# Patient Record
Sex: Female | Born: 1955 | Race: White | Hispanic: No | Marital: Married | Smoking: Former smoker
Health system: Southern US, Community
[De-identification: ages and names within clinical notes are randomized; demographics above are authoritative.]

## PROBLEM LIST (undated history)

## (undated) DIAGNOSIS — I10 Essential (primary) hypertension: Secondary | ICD-10-CM

## (undated) DIAGNOSIS — M1990 Unspecified osteoarthritis, unspecified site: Secondary | ICD-10-CM

## (undated) HISTORY — PX: TONSILLECTOMY: SUR1361

## (undated) HISTORY — PX: ABDOMINAL HYSTERECTOMY: SHX81

---

## 2004-08-27 ENCOUNTER — Ambulatory Visit: Payer: Self-pay

## 2005-10-21 ENCOUNTER — Ambulatory Visit: Payer: Self-pay | Admitting: Obstetrics and Gynecology

## 2005-10-28 ENCOUNTER — Ambulatory Visit: Payer: Self-pay | Admitting: Obstetrics and Gynecology

## 2006-10-26 ENCOUNTER — Ambulatory Visit: Payer: Self-pay | Admitting: Obstetrics and Gynecology

## 2007-03-13 ENCOUNTER — Ambulatory Visit: Payer: Self-pay | Admitting: Gastroenterology

## 2007-03-22 ENCOUNTER — Observation Stay: Payer: Self-pay | Admitting: Gastroenterology

## 2007-11-16 ENCOUNTER — Ambulatory Visit: Payer: Self-pay | Admitting: Obstetrics and Gynecology

## 2008-01-31 ENCOUNTER — Ambulatory Visit: Payer: Self-pay | Admitting: Unknown Physician Specialty

## 2008-11-19 ENCOUNTER — Ambulatory Visit: Payer: Self-pay | Admitting: Obstetrics and Gynecology

## 2008-11-28 ENCOUNTER — Ambulatory Visit: Payer: Self-pay | Admitting: Obstetrics and Gynecology

## 2009-06-17 ENCOUNTER — Ambulatory Visit: Payer: Self-pay | Admitting: Obstetrics and Gynecology

## 2009-12-03 ENCOUNTER — Ambulatory Visit: Payer: Self-pay | Admitting: Obstetrics and Gynecology

## 2010-04-27 ENCOUNTER — Ambulatory Visit: Payer: Self-pay | Admitting: Gastroenterology

## 2010-12-08 ENCOUNTER — Ambulatory Visit: Payer: Self-pay | Admitting: Obstetrics and Gynecology

## 2011-03-11 ENCOUNTER — Ambulatory Visit: Payer: Self-pay | Admitting: Podiatry

## 2011-12-16 ENCOUNTER — Ambulatory Visit: Payer: Self-pay | Admitting: Obstetrics and Gynecology

## 2012-12-26 ENCOUNTER — Ambulatory Visit: Payer: Self-pay | Admitting: Obstetrics and Gynecology

## 2014-01-28 ENCOUNTER — Ambulatory Visit: Payer: Self-pay | Admitting: Obstetrics and Gynecology

## 2014-07-07 ENCOUNTER — Ambulatory Visit: Payer: Self-pay | Admitting: Physician Assistant

## 2014-07-07 LAB — RAPID STREP-A WITH REFLX: Micro Text Report: NEGATIVE

## 2014-07-10 LAB — BETA STREP CULTURE(ARMC)

## 2014-12-31 ENCOUNTER — Other Ambulatory Visit: Payer: Self-pay | Admitting: Obstetrics and Gynecology

## 2014-12-31 DIAGNOSIS — Z1231 Encounter for screening mammogram for malignant neoplasm of breast: Secondary | ICD-10-CM

## 2015-01-30 ENCOUNTER — Ambulatory Visit: Admission: RE | Admit: 2015-01-30 | Payer: Self-pay | Source: Ambulatory Visit

## 2015-02-05 ENCOUNTER — Ambulatory Visit
Admission: RE | Admit: 2015-02-05 | Discharge: 2015-02-05 | Disposition: A | Discharge status: Home / Self Care | Payer: 59 | Source: Ambulatory Visit | Attending: Obstetrics and Gynecology | Admitting: Obstetrics and Gynecology

## 2015-02-05 DIAGNOSIS — Z1231 Encounter for screening mammogram for malignant neoplasm of breast: Secondary | ICD-10-CM | POA: Diagnosis present

## 2015-08-18 ENCOUNTER — Ambulatory Visit
Admission: RE | Admit: 2015-08-18 | Discharge: 2015-08-18 | Disposition: A | Discharge status: Home / Self Care | Payer: Commercial Managed Care - HMO | Source: Ambulatory Visit | Attending: Gastroenterology | Admitting: Gastroenterology

## 2015-08-18 ENCOUNTER — Ambulatory Visit: Payer: Commercial Managed Care - HMO | Admitting: Anesthesiology

## 2015-08-18 ENCOUNTER — Encounter: Payer: Self-pay | Admitting: *Deleted

## 2015-08-18 ENCOUNTER — Encounter
Admission: RE | Disposition: A | Discharge status: Home / Self Care | Payer: Self-pay | Source: Ambulatory Visit | Attending: Gastroenterology

## 2015-08-18 DIAGNOSIS — Z9889 Other specified postprocedural states: Secondary | ICD-10-CM | POA: Insufficient documentation

## 2015-08-18 DIAGNOSIS — Z8371 Family history of colonic polyps: Secondary | ICD-10-CM | POA: Diagnosis not present

## 2015-08-18 DIAGNOSIS — D124 Benign neoplasm of descending colon: Secondary | ICD-10-CM | POA: Diagnosis not present

## 2015-08-18 DIAGNOSIS — Z86010 Personal history of colonic polyps: Secondary | ICD-10-CM | POA: Diagnosis present

## 2015-08-18 DIAGNOSIS — Z90710 Acquired absence of both cervix and uterus: Secondary | ICD-10-CM | POA: Insufficient documentation

## 2015-08-18 DIAGNOSIS — Z79899 Other long term (current) drug therapy: Secondary | ICD-10-CM | POA: Diagnosis not present

## 2015-08-18 DIAGNOSIS — Z87891 Personal history of nicotine dependence: Secondary | ICD-10-CM | POA: Diagnosis not present

## 2015-08-18 DIAGNOSIS — Z1211 Encounter for screening for malignant neoplasm of colon: Secondary | ICD-10-CM | POA: Diagnosis not present

## 2015-08-18 HISTORY — PX: COLONOSCOPY WITH PROPOFOL: SHX5780

## 2015-08-18 SURGERY — COLONOSCOPY WITH PROPOFOL
Anesthesia: General

## 2015-08-18 MED ORDER — SODIUM CHLORIDE 0.9 % IV SOLN: INTRAVENOUS | Status: DC

## 2015-08-18 MED ORDER — SODIUM CHLORIDE 0.9 % IV SOLN
INTRAVENOUS | Status: DC
  Administered 2015-08-18 (×2): via INTRAVENOUS

## 2015-08-18 MED ORDER — PROPOFOL 10 MG/ML IV BOLUS
INTRAVENOUS | Status: DC | PRN
  Administered 2015-08-18: 50 mg via INTRAVENOUS
  Administered 2015-08-18: 30 mg via INTRAVENOUS

## 2015-08-18 MED ORDER — PROPOFOL 500 MG/50ML IV EMUL
INTRAVENOUS | Status: DC | PRN
  Administered 2015-08-18: 150 ug/kg/min via INTRAVENOUS

## 2015-08-18 MED ORDER — MIDAZOLAM HCL 2 MG/2ML IJ SOLN
INTRAMUSCULAR | Status: DC | PRN
  Administered 2015-08-18: 1 mg via INTRAVENOUS

## 2015-08-18 NOTE — H&P (Signed)
    Primary Care Physician:  Marcello Fennel, MD Primary Gastroenterologist:  Dr. Candace Cruise  Pre-Procedure History & Physical: HPI:  Sheryl Shaffer is a 60 y.o. female is here for an colonoscopy.   History reviewed. No pertinent past medical history.  Past Surgical History  Procedure Laterality Date  . Abdominal hysterectomy    . Tonsillectomy      Prior to Admission medications   Medication Sig Start Date End Date Taking? Authorizing Provider  BEE POLLEN PO Take by mouth.   Yes Historical Provider, MD  esomeprazole (NEXIUM) 40 MG capsule Take 40 mg by mouth daily at 12 noon.   Yes Historical Provider, MD  polyethylene glycol (MIRALAX / GLYCOLAX) packet Take 17 g by mouth daily.   Yes Historical Provider, MD    Allergies as of 06/17/2015  . (Not on File)    History reviewed. No pertinent family history.  Social History   Social History  . Marital Status: Married    Spouse Name: N/A  . Number of Children: N/A  . Years of Education: N/A   Occupational History  . Not on file.   Social History Main Topics  . Smoking status: Former Research scientist (life sciences)  . Smokeless tobacco: Not on file  . Alcohol Use: 0.6 oz/week    1 Glasses of wine per week  . Drug Use: No  . Sexual Activity: Not on file   Other Topics Concern  . Not on file   Social History Narrative    Review of Systems: See HPI, otherwise negative ROS  Physical Exam: BP 153/82 mmHg  Pulse 59  Temp(Src) 97 F (36.1 C) (Tympanic)  Resp 18  Ht 5\' 7"  (1.702 m)  Wt 83.008 kg (183 lb)  BMI 28.66 kg/m2  SpO2 100% General:   Alert,  pleasant and cooperative in NAD Head:  Normocephalic and atraumatic. Neck:  Supple; no masses or thyromegaly. Lungs:  Clear throughout to auscultation.    Heart:  Regular rate and rhythm. Abdomen:  Soft, nontender and nondistended. Normal bowel sounds, without guarding, and without rebound.   Neurologic:  Alert and  oriented x4;  grossly normal neurologically.  Impression/Plan: Sheryl Shaffer is here for an colonoscopy to be performed for personal hx of colon polyps Risks, benefits, limitations, and alternatives regarding  colonoscopy have been reviewed with the patient.  Questions have been answered.  All parties agreeable.   Sheryl Shaffer, Sheryl Dawn, MD  08/18/2015, 8:02 AM

## 2015-08-18 NOTE — Op Note (Signed)
[facility] Patient Name: Sheryl Shaffer Procedure Date: 08/18/2015 7:24 AM MRN: [id number] Account #: [id number] Date of Birth: 1956-07-15 Admit Type: Inpatient Age: 60 Room: [facility] ENDO ROOM 4 Gender: Female Note Status: Finalized Procedure:         Colonoscopy Indications:       Family history of colonic polyps in a first-degree                     relative, Personal history of colonic polyps Providers:         Lupita Dawn. Candace Cruise, MD Referring MD:      Marga Hoots (Referring MD) Medicines:         Monitored Anesthesia Care Complications:     No immediate complications. Procedure:         Pre-Anesthesia Assessment:                    - Prior to the procedure, a History and Physical was                     performed, and patient medications, allergies and                     sensitivities were reviewed. The patient's tolerance of                     previous anesthesia was reviewed.                    - The risks and benefits of the procedure and the sedation                     options and risks were discussed with the patient. All                     questions were answered and informed consent was obtained.                    - After reviewing the risks and benefits, the patient was                     deemed in satisfactory condition to undergo the procedure.                    After obtaining informed consent, the colonoscope was                     passed under direct vision. Throughout the procedure, the                     patient's blood pressure, pulse, and oxygen saturations                     were monitored continuously. The Olympus CF-H180AL                     colonoscope ( S#: [id number] ) was introduced through the and                     advanced to the the cecum, identified by appendiceal                     orifice and ileocecal valve. The colonoscopy was performed  without difficulty. The patient tolerated the  procedure                     well. The quality of the bowel preparation was good. Findings:      A small polyp was found in the descending colon. The polyp was sessile.       The polyp was removed with a cold snare. Resection and retrieval were       complete.      The exam was otherwise without abnormality. Impression:        - One small polyp in the descending colon. Resected and                     retrieved.                    - The examination was otherwise normal. Recommendation:    - Discharge patient to home.                    - Await pathology results.                    - Repeat colonoscopy in 5 years for surveillance.                    - The findings and recommendations were discussed with the                     patient. Procedure Code(s): --- Professional ---                    [phone], Colonoscopy, flexible; with removal of tumor(s),                     polyp(s), or other lesion(s) by snare technique Diagnosis Code(s): --- Professional ---                    D12.4, Benign neoplasm of descending colon                    Z83.71, Family history of colonic polyps                    Z86.010, Personal history of colonic polyps CPT copyright 2014 American Medical Association. All rights reserved. The codes documented in this report are preliminary and upon coder review may  be revised to meet current compliance requirements. Hulen Luster, MD 08/18/2015 8:27:55 AM This report has been signed electronically. Number of Addenda: 0 Note Initiated On: 08/18/2015 7:24 AM Scope Withdrawal Time: 0 hours 9 minutes 25 seconds  Total Procedure Duration: 0 hours 13 minutes 52 seconds       [facility]

## 2015-08-18 NOTE — Anesthesia Postprocedure Evaluation (Signed)
Anesthesia Post Note  Patient: Sheryl Shaffer  Procedure(s) Performed: Procedure(s) (LRB): COLONOSCOPY WITH PROPOFOL (N/A)  Patient location during evaluation: Endoscopy Anesthesia Type: General Level of consciousness: awake Pain management: pain level controlled Vital Signs Assessment: post-procedure vital signs reviewed and stable Respiratory status: spontaneous breathing Cardiovascular status: blood pressure returned to baseline Anesthetic complications: no    Last Vitals:  Filed Vitals:   08/18/15 0840 08/18/15 0850  BP: 170/86 177/82  Pulse:    Temp:    Resp:      Last Pain: There were no vitals filed for this visit.               Filippo Puls S

## 2015-08-18 NOTE — Transfer of Care (Signed)
Immediate Anesthesia Transfer of Care Note  Patient: Sheryl Shaffer  Procedure(s) Performed: Procedure(s): COLONOSCOPY WITH PROPOFOL (N/A)  Patient Location: PACU  Anesthesia Type:General  Level of Consciousness: sedated  Airway & Oxygen Therapy: Patient Spontanous Breathing and Patient connected to nasal cannula oxygen  Post-op Assessment: Report given to RN and Post -op Vital signs reviewed and stable  Post vital signs: Reviewed and stable  Last Vitals:  Filed Vitals:   08/18/15 0709  BP: 153/82  Pulse: 59  Temp: 36.1 C  Resp: 18    Complications: No apparent anesthesia complications

## 2015-08-18 NOTE — Anesthesia Preprocedure Evaluation (Signed)
Anesthesia Evaluation  Patient identified by MRN, date of birth, ID band Patient awake    Reviewed: Allergy & Precautions, NPO status , Patient's Chart, lab work & pertinent test results, reviewed documented beta blocker date and time   Airway Mallampati: II  TM Distance: >3 FB     Dental  (+) Chipped   Pulmonary former smoker,          Cardiovascular     Neuro/Psych    GI/Hepatic   Endo/Other    Renal/GU      Musculoskeletal   Abdominal   Peds  Hematology   Anesthesia Other Findings   Reproductive/Obstetrics                            Anesthesia Physical Anesthesia Plan  ASA: II  Anesthesia Plan: General   Post-op Pain Management:    Induction: Intravenous  Airway Management Planned: Nasal Cannula  Additional Equipment:   Intra-op Plan:   Post-operative Plan:   Informed Consent: I have reviewed the patients History and Physical, chart, labs and discussed the procedure including the risks, benefits and alternatives for the proposed anesthesia with the patient or authorized representative who has indicated his/her understanding and acceptance.     Plan Discussed with: CRNA  Anesthesia Plan Comments:         Anesthesia Quick Evaluation  

## 2015-08-18 NOTE — Anesthesia Procedure Notes (Signed)
Date/Time: 08/18/2015 8:14 AM Performed by: Nelda Marseille Pre-anesthesia Checklist: Patient identified, Emergency Drugs available, Suction available, Patient being monitored and Timeout performed

## 2015-08-19 ENCOUNTER — Encounter: Payer: Self-pay | Admitting: Gastroenterology

## 2015-08-19 LAB — SURGICAL PATHOLOGY

## 2015-12-17 ENCOUNTER — Emergency Department
Admission: EM | Admit: 2015-12-17 | Discharge: 2015-12-17 | Disposition: A | Discharge status: Home / Self Care | Payer: 59 | Attending: Emergency Medicine | Admitting: Emergency Medicine

## 2015-12-17 ENCOUNTER — Emergency Department: Payer: 59

## 2015-12-17 DIAGNOSIS — Y9389 Activity, other specified: Secondary | ICD-10-CM | POA: Diagnosis not present

## 2015-12-17 DIAGNOSIS — M7071 Other bursitis of hip, right hip: Secondary | ICD-10-CM | POA: Diagnosis not present

## 2015-12-17 DIAGNOSIS — Z87891 Personal history of nicotine dependence: Secondary | ICD-10-CM | POA: Insufficient documentation

## 2015-12-17 DIAGNOSIS — M25551 Pain in right hip: Secondary | ICD-10-CM | POA: Diagnosis present

## 2015-12-17 MED ORDER — NAPROXEN 500 MG PO TABS: 500.0000 mg | ORAL_TABLET | Freq: Two times a day (BID) | ORAL | Status: AC

## 2015-12-17 NOTE — ED Notes (Signed)
Patient ambulatory to triage with steady gait, without difficulty or distress noted; pt c/o right hip pain with no known injury, no hx of same

## 2015-12-17 NOTE — Discharge Instructions (Signed)
Hip Bursitis Bursitis is a swelling and soreness (inflammation) of a fluid-filled sac (bursa). This sac overlies and protects the joints.  CAUSES   Injury.  Overuse of the muscles surrounding the joint.  Arthritis.  Gout.  Infection.  Cold weather.  Inadequate warm-up and conditioning prior to activities. The cause may not be known.  SYMPTOMS   Mild to severe irritation.  Tenderness and swelling over the outside of the hip.  Pain with motion of the hip.  If the bursa becomes infected, a fever may be present. Redness, tenderness, and warmth will develop over the hip. Symptoms usually lessen in 3 to 4 weeks with treatment, but can come back. TREATMENT If conservative treatment does not work, your caregiver may advise draining the bursa and injecting cortisone into the area. This may speed up the healing process. This may also be used as an initial treatment of choice. HOME CARE INSTRUCTIONS   Apply ice to the affected area for 15-20 minutes every 3 to 4 hours while awake for the first 2 days. Put the ice in a plastic bag and place a towel between the bag of ice and your skin.  Rest the painful joint as much as possible, but continue to put the joint through a normal range of motion at least 4 times per day. When the pain lessens, begin normal, slow movements and usual activities to help prevent stiffness of the hip.  Only take over-the-counter or prescription medicines for pain, discomfort, or fever as directed by your caregiver.  Use crutches to limit weight bearing on the hip joint, if advised.  Elevate your painful hip to reduce swelling. Use pillows for propping and cushioning your legs and hips.  Gentle massage may provide comfort and decrease swelling. SEEK IMMEDIATE MEDICAL CARE IF:   Your pain increases even during treatment, or you are not improving.  You have a fever.  You have heat and inflammation over the involved bursa.  You have any other questions or  concerns. MAKE SURE YOU:   Understand these instructions.  Will watch your condition.  Will get help right away if you are not doing well or get worse.   This information is not intended to replace advice given to you by your health care provider. Make sure you discuss any questions you have with your health care provider.   Document Released: 12/25/2001 Document Revised: 09/27/2011 Document Reviewed: 02/04/2015 Elsevier Interactive Patient Education 2016 [employer] Inc[employer]  

## 2015-12-17 NOTE — ED Provider Notes (Signed)
[facility] Emergency Department Provider Note  ____________________________________________  Time seen: Approximately 7:07 AM  I have reviewed the triage vital signs and the nursing notes.   HISTORY  Chief Complaint Hip Pain    HPI Sheryl Shaffer is a 60 y.o. female , NAD, presents to the emergency department with three-day history of right hip pain. States she has had off and on right hip pain over the last couple weeks with worsening over the last 3 days. Denies any injuries, traumas, falls. States she can wake in the middle the night laying on the hip and noting pain. States the pain decreases when she turns over. Has taken [facility] powders with significant relief of pain. Has not noted any redness, warmth, swelling or skin sores about the area. Does have a remote history of bursitis of the left hip but states it was so long ago that she is uncertain if it similar. Has been able to ambulate without any significant pain but does note that she can feel an ache in the hip with ambulation. Denies any lower back pain, saddle paresthesias, loss of bowel or bladder control. Has not had any abdominal pain, nausea, vomiting, diarrhea. Denies lower extremity pain or swelling.   No past medical history on file.  There are no active problems to display for this patient.   Past Surgical History  Procedure Laterality Date  . Abdominal hysterectomy    . Tonsillectomy    . Colonoscopy with propofol N/A 08/18/2015    Procedure: COLONOSCOPY WITH PROPOFOL;  Surgeon: Hulen Luster, MD;  Location[facility] ENDOSCOPY;  Service: Gastroenterology;  Laterality: N/A;    Current Outpatient Rx  Name  Route  Sig  Dispense  Refill  . BEE POLLEN PO   Oral   Take by mouth.         . esomeprazole (NEXIUM) 40 MG capsule   Oral   Take 40 mg by mouth daily at 12 noon.         . naproxen (NAPROSYN) 500 MG tablet   Oral   Take 1 tablet (500 mg total) by mouth 2 (two) times daily with a  meal.   30 tablet   0   . polyethylene glycol (MIRALAX / GLYCOLAX) packet   Oral   Take 17 g by mouth daily.           Allergies Penicillins  No family history on file.  Social History Social History  Substance Use Topics  . Smoking status: Former Research scientist (life sciences)  . Smokeless tobacco: Not on file  . Alcohol Use: 0.6 oz/week    1 Glasses of wine per week     Review of Systems  Constitutional: No fever/chills Cardiovascular: No chest pain. Respiratory: No shortness of breath. No wheezing.  Gastrointestinal: No abdominal pain.  No nausea, vomiting.  No diarrhea.  No constipation. Genitourinary: Negative for dysuria. No hematuria. No urinary hesitancy, urgency or increased frequency. Musculoskeletal: Positive right hip pain. Negative for back pain.  Skin: Negative for rash, redness, swelling, skin sores. Neurological: Negative for headaches, focal weakness or numbness. No saddle paresthesias, tingling, loss of bowel or bladder control. 10-point ROS otherwise negative.  ____________________________________________   PHYSICAL EXAM:  VITAL SIGNS: ED Triage Vitals  Enc Vitals Group     BP 12/17/15 0700 152/84 mmHg     Pulse Rate 12/17/15 0700 65     Resp 12/17/15 0700 16     Temp 12/17/15 0700 98 F (36.7 C)  Temp Source 12/17/15 0700 Oral     SpO2 12/17/15 0700 95 %     Weight 12/17/15 0700 178 lb (80.74 kg)     Height 12/17/15 0700 5\' 7"  (1.702 m)     Head Cir --      Peak Flow --      Pain Score 12/17/15 0657 8     Pain Loc --      Pain Edu? --      Excl. in [place]? --      Constitutional: Alert and oriented. Well appearing and in no acute distress. Eyes: Conjunctivae are normal. Head: Atraumatic. Cardiovascular: Good peripheral circulationNoting 2+ pulses in the right lower extremity. Respiratory: Normal respiratory effort without tachypnea or retractions.  Musculoskeletal: Tenderness to palpation about the right trochanter area. No tenderness about the upper  hip or proximal pelvic girdle. Full range of motion of the right hip is noted. No lower extremity tenderness nor edema.  No joint effusions. Neurologic:  Normal speech and language. No gross focal neurologic deficits are appreciated. Normal gait and posture Skin:  Skin is warm, dry and intact. No rash, redness, swelling, skin sores, noted. Psychiatric: Mood and affect are normal. Speech and behavior are normal. Patient exhibits appropriate insight and judgement.   ____________________________________________   LABS  None ____________________________________________  EKG  None ____________________________________________  RADIOLOGY I have personally viewed and evaluated these images (plain radiographs) as part of my medical decision making, as well as reviewing the written report by the radiologist.  Dg Hip Unilat With Pelvis 2-3 Views Right  12/17/2015  CLINICAL DATA:  Right hip pain for 1 week EXAM: DG HIP (WITH OR WITHOUT PELVIS) 2-3V RIGHT COMPARISON:  None. FINDINGS: No acute fracture. No dislocation. Unremarkable soft tissues. Mild joint space narrowing of the hip joints. IMPRESSION: No acute bony pathology. Electronically Signed   By: Marybelle Killings M.D.   On: 12/17/2015 07:52    ____________________________________________    PROCEDURES  Procedure(s) performed: None    Medications - No data to display   ____________________________________________   INITIAL IMPRESSION / ASSESSMENT AND PLAN / ED COURSE  Pertinent imaging results that were available during my care of the patient were reviewed by me and considered in my medical decision making (see chart for details).  Patient's diagnosis is consistent with bursitis of right hip. Patient will be discharged home with prescriptions for naproxen to take as directed. Patient advised to stop use of BC powders. Patient is to follow up with Dr. Sabra Heck in orthopedics if symptoms persist past this treatment course. Patient is  given ED precautions to return to the ED for any worsening or new symptoms.    ____________________________________________  FINAL CLINICAL IMPRESSION(S) / ED DIAGNOSES  Final diagnoses:  Bursitis of right hip      NEW MEDICATIONS STARTED DURING THIS VISIT:  Discharge Medication List as of 12/17/2015  8:09 AM    START taking these medications   Details  naproxen (NAPROSYN) 500 MG tablet Take 1 tablet (500 mg total) by mouth 2 (two) times daily with a meal., Starting 12/17/2015, Until Discontinued, Print             Braxton Feathers, PA-C 12/17/15 [id number]  Delman Kitten, MD 12/17/15 [phone]

## 2016-01-21 ENCOUNTER — Other Ambulatory Visit: Payer: Self-pay | Admitting: Obstetrics and Gynecology

## 2016-01-21 DIAGNOSIS — Z1231 Encounter for screening mammogram for malignant neoplasm of breast: Secondary | ICD-10-CM

## 2016-02-16 ENCOUNTER — Other Ambulatory Visit: Payer: Self-pay | Admitting: Obstetrics and Gynecology

## 2016-02-16 ENCOUNTER — Ambulatory Visit
Admission: RE | Admit: 2016-02-16 | Discharge: 2016-02-16 | Disposition: A | Discharge status: Home / Self Care | Payer: 59 | Source: Ambulatory Visit | Attending: Obstetrics and Gynecology | Admitting: Obstetrics and Gynecology

## 2016-02-16 DIAGNOSIS — Z1231 Encounter for screening mammogram for malignant neoplasm of breast: Secondary | ICD-10-CM | POA: Diagnosis not present

## 2017-01-12 ENCOUNTER — Other Ambulatory Visit: Payer: Self-pay | Admitting: Family Medicine

## 2017-01-12 DIAGNOSIS — Z1231 Encounter for screening mammogram for malignant neoplasm of breast: Secondary | ICD-10-CM

## 2017-02-16 ENCOUNTER — Ambulatory Visit
Admission: RE | Admit: 2017-02-16 | Discharge: 2017-02-16 | Disposition: A | Discharge status: Home / Self Care | Payer: 59 | Source: Ambulatory Visit | Attending: Family Medicine | Admitting: Family Medicine

## 2017-02-16 DIAGNOSIS — Z1231 Encounter for screening mammogram for malignant neoplasm of breast: Secondary | ICD-10-CM | POA: Diagnosis not present

## 2017-07-29 ENCOUNTER — Ambulatory Visit
Admission: RE | Admit: 2017-07-29 | Discharge: 2017-07-29 | Disposition: A | Discharge status: Home / Self Care | Payer: 59 | Source: Ambulatory Visit | Attending: Unknown Physician Specialty | Admitting: Unknown Physician Specialty

## 2017-07-29 ENCOUNTER — Other Ambulatory Visit: Payer: Self-pay | Admitting: Unknown Physician Specialty

## 2017-07-29 DIAGNOSIS — R05 Cough: Secondary | ICD-10-CM

## 2017-07-29 DIAGNOSIS — R059 Cough, unspecified: Secondary | ICD-10-CM

## 2018-01-17 ENCOUNTER — Other Ambulatory Visit: Payer: Self-pay | Admitting: Family Medicine

## 2018-01-17 DIAGNOSIS — Z1231 Encounter for screening mammogram for malignant neoplasm of breast: Secondary | ICD-10-CM

## 2018-02-20 ENCOUNTER — Ambulatory Visit
Admission: RE | Admit: 2018-02-20 | Discharge: 2018-02-20 | Disposition: A | Discharge status: Home / Self Care | Payer: 59 | Source: Ambulatory Visit | Attending: Family Medicine | Admitting: Family Medicine

## 2018-02-20 DIAGNOSIS — Z1231 Encounter for screening mammogram for malignant neoplasm of breast: Secondary | ICD-10-CM | POA: Insufficient documentation

## 2019-01-08 ENCOUNTER — Encounter: Payer: Self-pay | Admitting: Podiatry

## 2019-01-08 ENCOUNTER — Ambulatory Visit: Payer: Self-pay

## 2019-01-15 NOTE — Progress Notes (Signed)
This encounter was created in error - please disregard.

## 2019-03-14 ENCOUNTER — Other Ambulatory Visit: Payer: Self-pay

## 2019-03-14 DIAGNOSIS — Z20822 Contact with and (suspected) exposure to covid-19: Secondary | ICD-10-CM

## 2019-03-15 LAB — NOVEL CORONAVIRUS, NAA: SARS-CoV-2, NAA: NOT DETECTED

## 2019-04-05 ENCOUNTER — Other Ambulatory Visit: Payer: Self-pay | Admitting: Family Medicine

## 2019-04-05 DIAGNOSIS — Z1231 Encounter for screening mammogram for malignant neoplasm of breast: Secondary | ICD-10-CM

## 2019-05-16 ENCOUNTER — Ambulatory Visit
Admission: RE | Admit: 2019-05-16 | Discharge: 2019-05-16 | Disposition: A | Discharge status: Home / Self Care | Payer: 59 | Source: Ambulatory Visit | Attending: Family Medicine | Admitting: Family Medicine

## 2019-05-16 DIAGNOSIS — Z1231 Encounter for screening mammogram for malignant neoplasm of breast: Secondary | ICD-10-CM

## 2020-04-14 ENCOUNTER — Other Ambulatory Visit: Payer: Self-pay | Admitting: Family Medicine

## 2020-04-14 DIAGNOSIS — Z1231 Encounter for screening mammogram for malignant neoplasm of breast: Secondary | ICD-10-CM

## 2020-05-22 ENCOUNTER — Ambulatory Visit
Admission: RE | Admit: 2020-05-22 | Discharge: 2020-05-22 | Disposition: A | Discharge status: Home / Self Care | Payer: 59 | Source: Ambulatory Visit | Attending: Family Medicine | Admitting: Family Medicine

## 2020-05-22 ENCOUNTER — Other Ambulatory Visit: Payer: Self-pay

## 2020-05-22 DIAGNOSIS — Z1231 Encounter for screening mammogram for malignant neoplasm of breast: Secondary | ICD-10-CM | POA: Diagnosis present

## 2021-03-04 ENCOUNTER — Other Ambulatory Visit: Payer: Self-pay | Admitting: Unknown Physician Specialty

## 2021-03-04 DIAGNOSIS — J3801 Paralysis of vocal cords and larynx, unilateral: Secondary | ICD-10-CM

## 2021-03-18 ENCOUNTER — Ambulatory Visit
Admission: RE | Admit: 2021-03-18 | Discharge: 2021-03-18 | Disposition: A | Discharge status: Home / Self Care | Payer: Medicare Other | Source: Ambulatory Visit | Attending: Unknown Physician Specialty | Admitting: Unknown Physician Specialty

## 2021-03-18 ENCOUNTER — Other Ambulatory Visit: Payer: Self-pay

## 2021-03-18 DIAGNOSIS — I6782 Cerebral ischemia: Secondary | ICD-10-CM | POA: Diagnosis not present

## 2021-03-18 DIAGNOSIS — J3801 Paralysis of vocal cords and larynx, unilateral: Secondary | ICD-10-CM | POA: Diagnosis present

## 2021-03-18 LAB — POCT I-STAT CREATININE: Creatinine, Ser: 0.8 mg/dL (ref 0.44–1.00)

## 2021-03-18 MED ORDER — IOHEXOL 350 MG/ML SOLN
100.0000 mL | Freq: Once | INTRAVENOUS | Status: AC | PRN
  Administered 2021-03-18: 100 mL via INTRAVENOUS

## 2021-04-20 ENCOUNTER — Other Ambulatory Visit: Payer: Self-pay | Admitting: Family Medicine

## 2021-04-20 DIAGNOSIS — Z1231 Encounter for screening mammogram for malignant neoplasm of breast: Secondary | ICD-10-CM

## 2021-06-02 ENCOUNTER — Ambulatory Visit
Admission: RE | Admit: 2021-06-02 | Discharge: 2021-06-02 | Disposition: A | Discharge status: Home / Self Care | Payer: Medicare Other | Source: Ambulatory Visit | Attending: Family Medicine | Admitting: Family Medicine

## 2021-06-02 ENCOUNTER — Other Ambulatory Visit: Payer: Self-pay

## 2021-06-02 DIAGNOSIS — Z1231 Encounter for screening mammogram for malignant neoplasm of breast: Secondary | ICD-10-CM | POA: Insufficient documentation

## 2021-07-02 ENCOUNTER — Ambulatory Visit: Payer: Medicare Other

## 2022-04-13 IMAGING — MG MM DIGITAL SCREENING BILAT W/ TOMO AND CAD
6 of 10 series · 6 of 30 positions shown · non-contrast
Comparison: Previous exam(s).

CLINICAL DATA: Screening.

EXAM:
DIGITAL SCREENING BILATERAL MAMMOGRAM WITH TOMOSYNTHESIS AND CAD
TECHNIQUE: Bilateral screening digital craniocaudal and mediolateral oblique
mammograms were obtained. Bilateral screening digital breast
tomosynthesis was performed. The images were evaluated with
computer-aided detection.

[R MLO synth-2D]
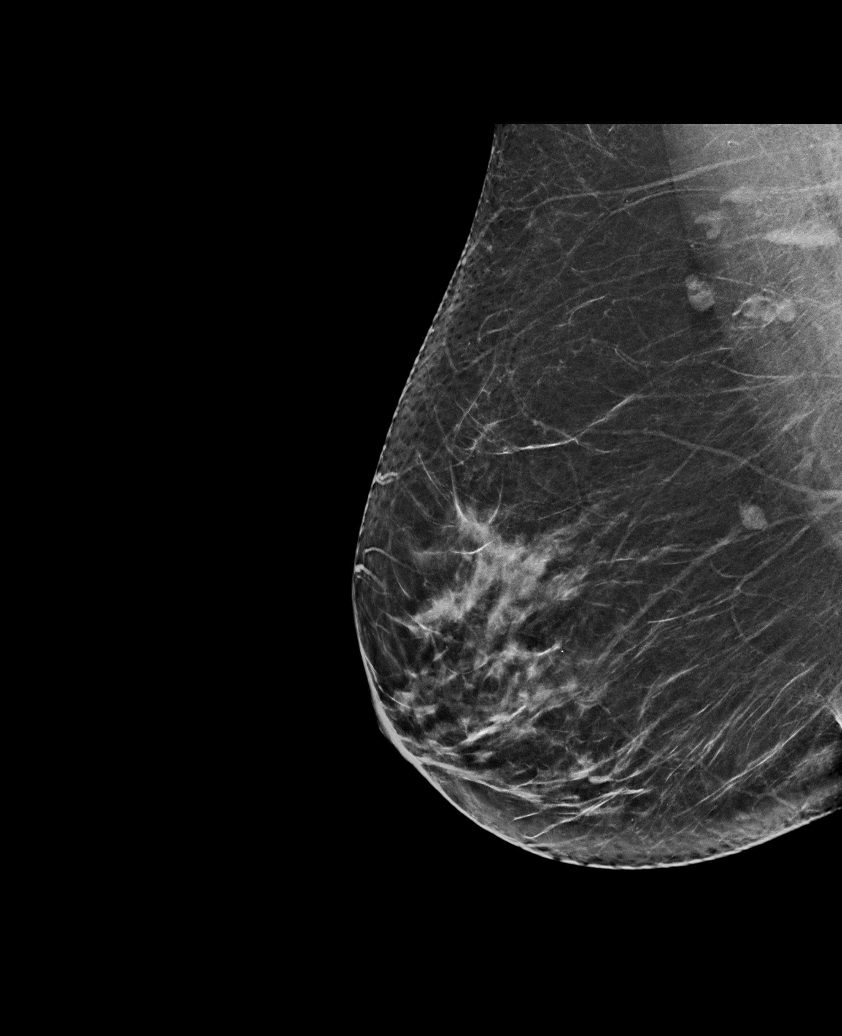

[L MLO synth-2D]
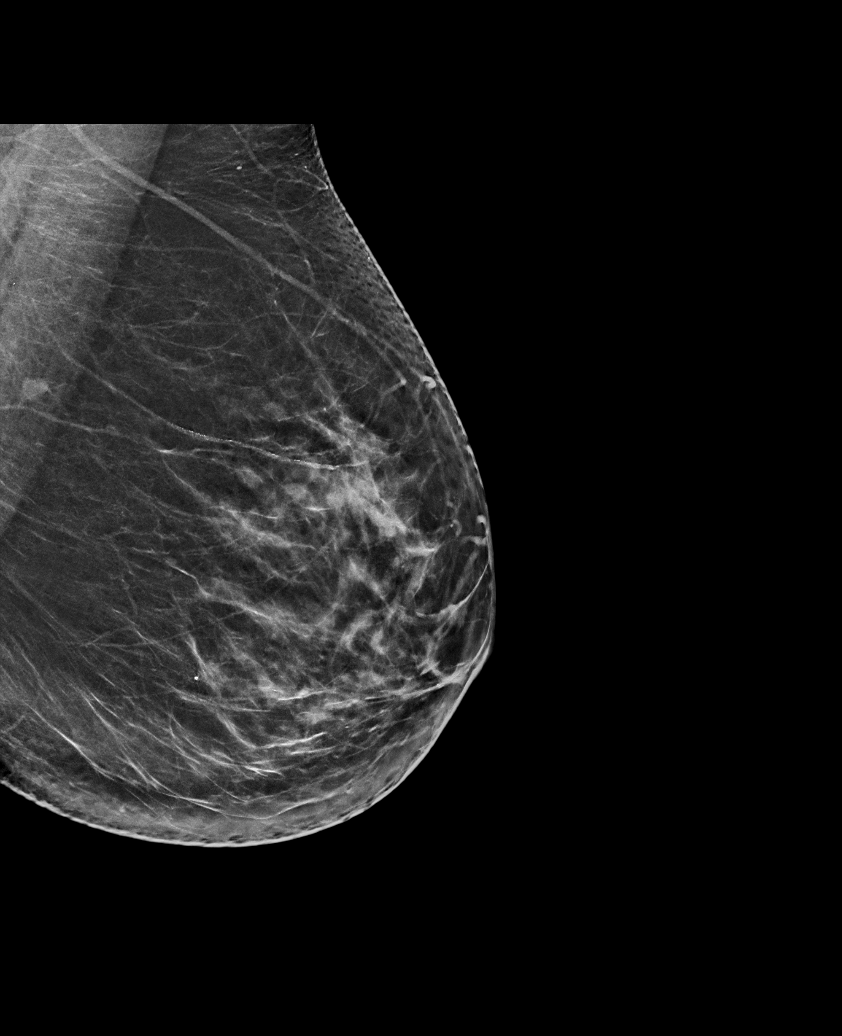

[R CC synth-2D (1 of 2)]
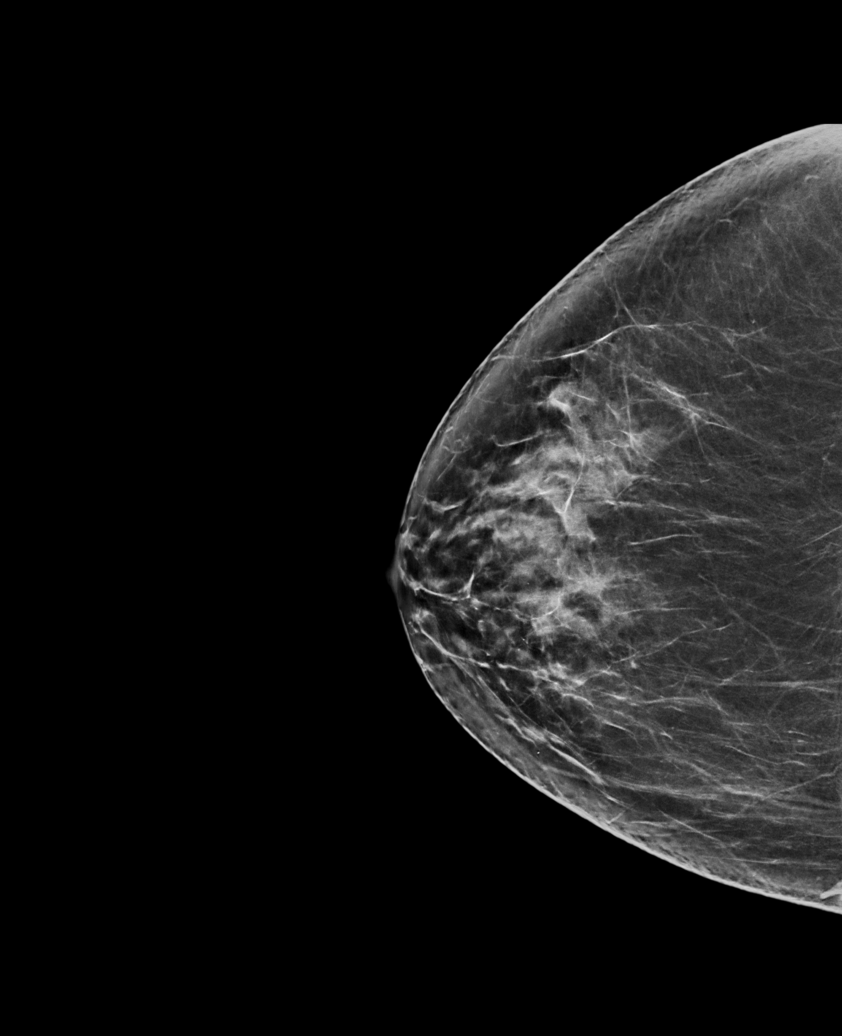

[R CC synth-2D (2 of 2)]
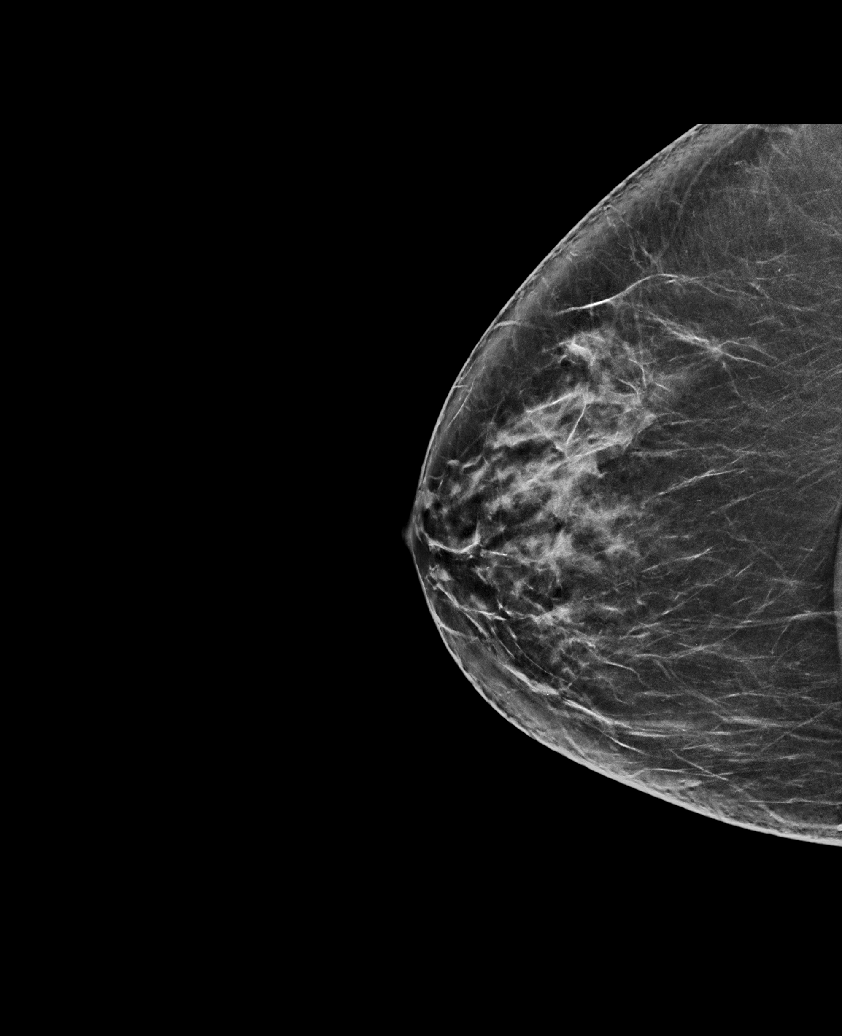

[L CC synth-2D]
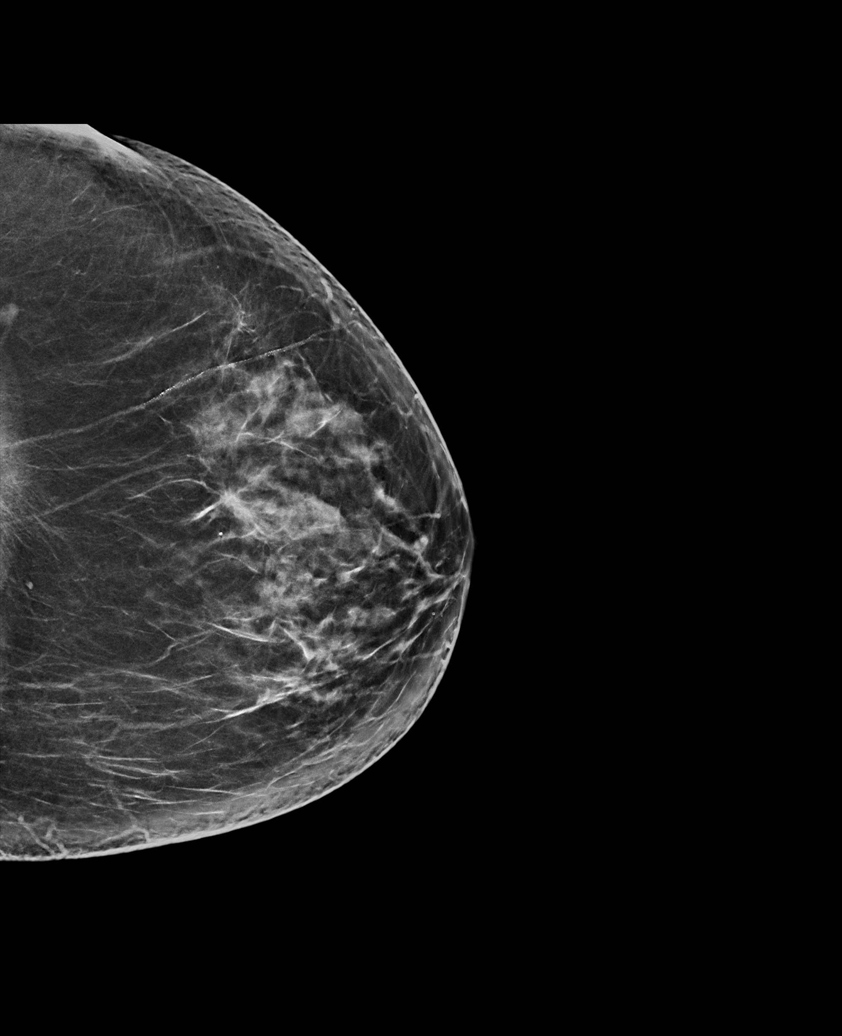

[R CC tomo · tomo slice 37/72.0]
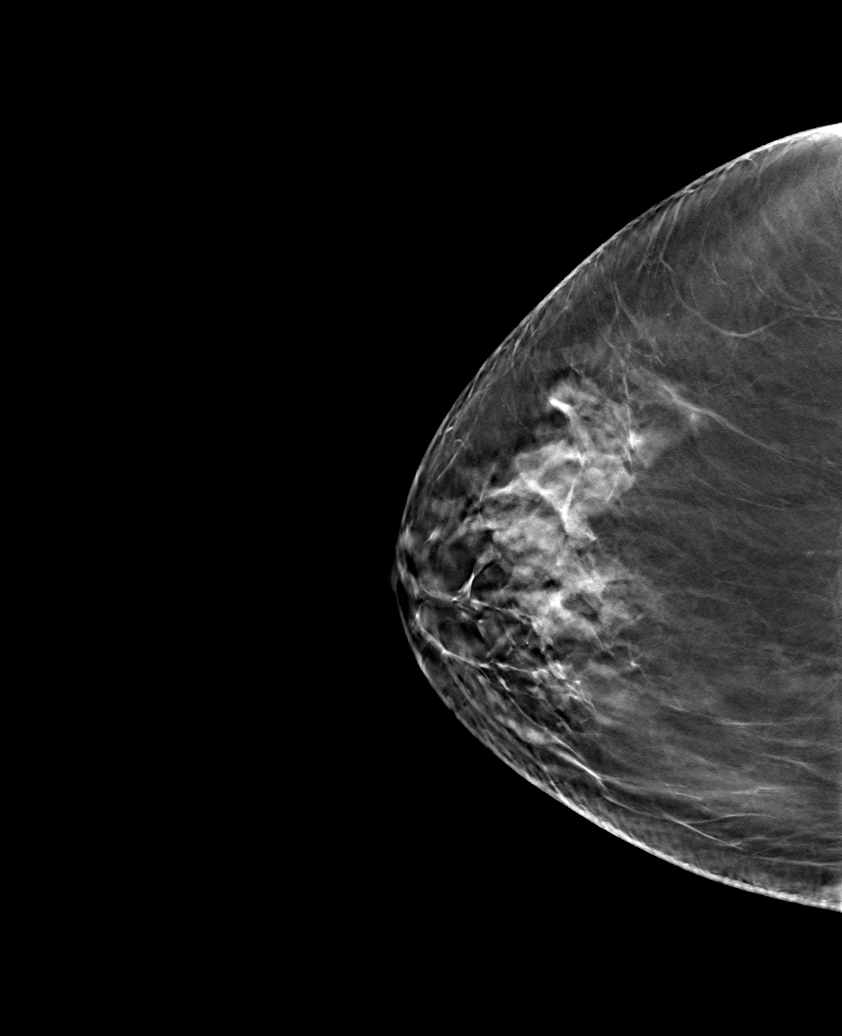

[6 of 30 positions shown; findings below may reference images not displayed]

ACR Breast Density Category c: The breast tissue is heterogeneously
dense, which may obscure small masses.
FINDINGS: There are no findings suspicious for malignancy.
IMPRESSION: No mammographic evidence of malignancy. A result letter of this
screening mammogram will be mailed directly to the patient.

RECOMMENDATION:
Screening mammogram in one year. (Code:Q3-W-BC3)

BI-RADS CATEGORY  1: Negative.

## 2022-05-11 ENCOUNTER — Other Ambulatory Visit: Payer: Self-pay | Admitting: Family Medicine

## 2022-05-11 DIAGNOSIS — Z1231 Encounter for screening mammogram for malignant neoplasm of breast: Secondary | ICD-10-CM

## 2022-06-16 ENCOUNTER — Ambulatory Visit
Admission: RE | Admit: 2022-06-16 | Discharge: 2022-06-16 | Disposition: A | Discharge status: Home / Self Care | Payer: Medicare Other | Source: Ambulatory Visit | Attending: Family Medicine | Admitting: Family Medicine

## 2022-06-16 DIAGNOSIS — Z1231 Encounter for screening mammogram for malignant neoplasm of breast: Secondary | ICD-10-CM | POA: Insufficient documentation

## 2022-06-30 ENCOUNTER — Ambulatory Visit
Admission: RE | Admit: 2022-06-30 | Discharge: 2022-06-30 | Disposition: A | Discharge status: Home / Self Care | Payer: Medicare Other | Attending: Internal Medicine | Admitting: Internal Medicine

## 2022-06-30 ENCOUNTER — Other Ambulatory Visit: Payer: Self-pay

## 2022-06-30 ENCOUNTER — Encounter: Payer: Self-pay | Admitting: Internal Medicine

## 2022-06-30 ENCOUNTER — Encounter
Admission: RE | Disposition: A | Discharge status: Home / Self Care | Payer: Self-pay | Source: Home / Self Care | Attending: Internal Medicine

## 2022-06-30 ENCOUNTER — Ambulatory Visit: Payer: Medicare Other | Admitting: Certified Registered"

## 2022-06-30 DIAGNOSIS — J383 Other diseases of vocal cords: Secondary | ICD-10-CM | POA: Diagnosis not present

## 2022-06-30 DIAGNOSIS — I10 Essential (primary) hypertension: Secondary | ICD-10-CM | POA: Insufficient documentation

## 2022-06-30 DIAGNOSIS — Z87891 Personal history of nicotine dependence: Secondary | ICD-10-CM | POA: Diagnosis not present

## 2022-06-30 DIAGNOSIS — D123 Benign neoplasm of transverse colon: Secondary | ICD-10-CM | POA: Insufficient documentation

## 2022-06-30 DIAGNOSIS — Z1211 Encounter for screening for malignant neoplasm of colon: Secondary | ICD-10-CM | POA: Insufficient documentation

## 2022-06-30 DIAGNOSIS — Z963 Presence of artificial larynx: Secondary | ICD-10-CM | POA: Diagnosis not present

## 2022-06-30 DIAGNOSIS — Z86010 Personal history of colonic polyps: Secondary | ICD-10-CM | POA: Diagnosis not present

## 2022-06-30 DIAGNOSIS — K640 First degree hemorrhoids: Secondary | ICD-10-CM | POA: Insufficient documentation

## 2022-06-30 DIAGNOSIS — M1990 Unspecified osteoarthritis, unspecified site: Secondary | ICD-10-CM | POA: Diagnosis not present

## 2022-06-30 HISTORY — DX: Essential (primary) hypertension: I10

## 2022-06-30 HISTORY — DX: Unspecified osteoarthritis, unspecified site: M19.90

## 2022-06-30 HISTORY — PX: COLONOSCOPY WITH PROPOFOL: SHX5780

## 2022-06-30 SURGERY — COLONOSCOPY WITH PROPOFOL
Anesthesia: General

## 2022-06-30 MED ORDER — PROPOFOL 10 MG/ML IV BOLUS
INTRAVENOUS | Status: AC
  Filled 2022-06-30: qty 40

## 2022-06-30 MED ORDER — PROPOFOL 10 MG/ML IV BOLUS
INTRAVENOUS | Status: DC | PRN
  Administered 2022-06-30: 50 mg via INTRAVENOUS

## 2022-06-30 MED ORDER — SODIUM CHLORIDE 0.9 % IV SOLN: INTRAVENOUS | Status: DC

## 2022-06-30 MED ORDER — LIDOCAINE HCL (CARDIAC) PF 100 MG/5ML IV SOSY
PREFILLED_SYRINGE | INTRAVENOUS | Status: DC | PRN
  Administered 2022-06-30 (×2): 50 mg via INTRAVENOUS

## 2022-06-30 MED ORDER — GLYCOPYRROLATE 0.2 MG/ML IJ SOLN
INTRAMUSCULAR | Status: AC
  Filled 2022-06-30: qty 1

## 2022-06-30 MED ORDER — PROPOFOL 500 MG/50ML IV EMUL
INTRAVENOUS | Status: DC | PRN
  Administered 2022-06-30: 150 ug/kg/min via INTRAVENOUS

## 2022-06-30 MED ORDER — LIDOCAINE HCL (PF) 2 % IJ SOLN
INTRAMUSCULAR | Status: AC
  Filled 2022-06-30: qty 5

## 2022-06-30 MED ORDER — GLYCOPYRROLATE 0.2 MG/ML IJ SOLN
INTRAMUSCULAR | Status: DC | PRN
  Administered 2022-06-30: .2 mg via INTRAVENOUS

## 2022-06-30 NOTE — Transfer of Care (Signed)
Immediate Anesthesia Transfer of Care Note  Patient: Sheryl Shaffer  Procedure(s) Performed: COLONOSCOPY WITH PROPOFOL  Patient Location: PACU  Anesthesia Type:General  Level of Consciousness: awake, alert , oriented, and patient cooperative  Airway & Oxygen Therapy: Patient Spontanous Breathing and Patient connected to face mask oxygen  Post-op Assessment: Report given to RN and Post -op Vital signs reviewed and stable  Post vital signs: Reviewed and stable  Last Vitals:  Vitals Value Taken Time  BP    Temp    Pulse    Resp    SpO2      Last Pain:  Vitals:   06/30/22 0845  TempSrc: Temporal  PainSc: 0-No pain         Complications: No notable events documented.

## 2022-06-30 NOTE — Anesthesia Postprocedure Evaluation (Signed)
Anesthesia Post Note  Patient: Sheryl Shaffer  Procedure(s) Performed: COLONOSCOPY WITH PROPOFOL  Patient location during evaluation: Endoscopy Anesthesia Type: General Level of consciousness: awake and alert Pain management: pain level controlled Vital Signs Assessment: post-procedure vital signs reviewed and stable Respiratory status: spontaneous breathing, nonlabored ventilation, respiratory function stable and patient connected to nasal cannula oxygen Cardiovascular status: blood pressure returned to baseline and stable Postop Assessment: no apparent nausea or vomiting Anesthetic complications: no   No notable events documented.   Last Vitals:  Vitals:   06/30/22 0845 06/30/22 0930  BP: (!) 141/85 118/75  Pulse: (!) 56 63  Resp: 16 15  Temp: (!) 36.2 C (!) 35.7 C  SpO2: 96% 98%    Last Pain:  Vitals:   06/30/22 0930  TempSrc: Temporal  PainSc: Asleep                 Arita Miss

## 2022-06-30 NOTE — H&P (Signed)
  Outpatient short stay form Pre-procedure 06/30/2022 8:30 AM Sheryl Shaffer K. Alice Reichert, M.D.  Primary Physician: Derinda Late, M.D.  Reason for visit:  Personal history of adenomatous colon polyps  History of present illness:    08/18/2015 Physician: Dr. Mamie Nick. Oh @ [facility] Polyps Removed: Adenomatous Polyp  Patient denies change in bowel habits, rectal bleeding, weight loss or abdominal pain.   No current facility-administered medications for this encounter.  Medications Prior to Admission  Medication Sig Dispense Refill Last Dose   BEE POLLEN PO Take by mouth.      esomeprazole (NEXIUM) 40 MG capsule Take 40 mg by mouth daily at 12 noon.      naproxen (NAPROSYN) 500 MG tablet Take 1 tablet (500 mg total) by mouth 2 (two) times daily with a meal. 30 tablet 0    polyethylene glycol (MIRALAX / GLYCOLAX) packet Take 17 g by mouth daily.        Allergies  Allergen Reactions   Penicillins      No past medical history on file.  Review of systems:  Otherwise negative.    Physical Exam  Gen: Alert, oriented. Appears stated age.  HEENT: [place]/AT. PERRLA. Lungs: CTA, no wheezes. CV: RR nl S1, S2. Abd: soft, benign, no masses. BS+ Ext: No edema. Pulses 2+    Planned procedures: Proceed with colonoscopy. The patient understands the nature of the planned procedure, indications, risks, alternatives and potential complications including but not limited to bleeding, infection, perforation, damage to internal organs and possible oversedation/side effects from anesthesia. The patient agrees and gives consent to proceed.  Please refer to procedure notes for findings, recommendations and patient disposition/instructions.     Donalyn Schneeberger K. Alice Reichert, M.D. Gastroenterology 06/30/2022  8:30 AM

## 2022-06-30 NOTE — Op Note (Signed)
[facility] Gastroenterology Patient Name: Sheryl Shaffer Procedure Date: 06/30/2022 9:07 AM MRN: [id number] Account #: [id number] Date of Birth: 01-25-1956 Admit Type: Outpatient Age: 66 Room: [facility] ENDO ROOM 2 Gender: Female Note Status: Finalized Instrument Name: Jasper Riling [id number] Procedure:             Colonoscopy Indications:           Surveillance: Personal history of adenomatous polyps                         on last colonoscopy > 5 years ago Providers:             Lorie Apley K. Alice Reichert MD, MD Referring MD:          Benay Pike. Alice Reichert MD, MD (Referring MD), Caprice Renshaw MD (Referring MD) Medicines:             Propofol per Anesthesia Complications:         No immediate complications. Procedure:             Pre-Anesthesia Assessment:                        - The risks and benefits of the procedure and the                         sedation options and risks were discussed with the                         patient. All questions were answered and informed                         consent was obtained.                        - Patient identification and proposed procedure were                         verified prior to the procedure by the nurse. The                         procedure was verified in the procedure room.                        - ASA Grade Assessment: III - A patient with severe                         systemic disease.                        - After reviewing the risks and benefits, the patient                         was deemed in satisfactory condition to undergo the                         procedure.  After obtaining informed consent, the colonoscope was                         passed under direct vision. Throughout the procedure,                         the patient's blood pressure, pulse, and oxygen                         saturations were monitored continuously. The                          Colonoscope was introduced through the anus and                         advanced to the the cecum, identified by appendiceal                         orifice and ileocecal valve. The colonoscopy was                         performed without difficulty. The patient tolerated                         the procedure well. The quality of the bowel                         preparation was good. The ileocecal valve, appendiceal                         orifice, and rectum were photographed. Findings:      The perianal and digital rectal examinations were normal. Pertinent       negatives include normal sphincter tone and no palpable rectal lesions.      Non-bleeding internal hemorrhoids were found during retroflexion. The       hemorrhoids were Grade I (internal hemorrhoids that do not prolapse).      A 6 mm polyp was found in the transverse colon. The polyp was sessile.       The polyp was removed with a piecemeal technique using a cold biopsy       forceps. Resection and retrieval were complete.      The exam was otherwise without abnormality. Impression:            - Non-bleeding internal hemorrhoids.                        - One 6 mm polyp in the transverse colon, removed                         piecemeal using a cold biopsy forceps. Resected and                         retrieved.                        - The examination was otherwise normal. Recommendation:        - Patient has a contact number available for  emergencies. The signs and symptoms of potential                         delayed complications were discussed with the patient.                         Return to normal activities tomorrow. Written                         discharge instructions were provided to the patient.                        - Resume previous diet.                        - Continue present medications.                        - Await pathology results.                        - Repeat colonoscopy  in 5 years for surveillance.                        - Return to GI office PRN.                        - The findings and recommendations were discussed with                         the patient. Procedure Code(s):     --- Professional ---                        [phone], Colonoscopy, flexible; with biopsy, single or                         multiple Diagnosis Code(s):     --- Professional ---                        K64.0, First degree hemorrhoids                        D12.3, Benign neoplasm of transverse colon (hepatic                         flexure or splenic flexure)                        Z86.010, Personal history of colonic polyps CPT copyright 2022 [employer]. All rights reserved. The codes documented in this report are preliminary and upon coder review may  be revised to meet current compliance requirements. Efrain Sella MD, MD 06/30/2022 9:29:55 AM This report has been signed electronically. Number of Addenda: 0 Note Initiated On: 06/30/2022 9:07 AM Scope Withdrawal Time: 0 hours 6 minutes 5 seconds  Total Procedure Duration: 0 hours 9 minutes 11 seconds  Estimated Blood Loss:  Estimated blood loss: none.      [facility]

## 2022-06-30 NOTE — Interval H&P Note (Signed)
History and Physical Interval Note:  06/30/2022 8:30 AM  Sheryl Shaffer  has presented today for surgery, with the diagnosis of personal history adenomatous polyps.  The various methods of treatment have been discussed with the patient and family. After consideration of risks, benefits and other options for treatment, the patient has consented to  Procedure(s): COLONOSCOPY WITH PROPOFOL (N/A) as a surgical intervention.  The patient's history has been reviewed, patient examined, no change in status, stable for surgery.  I have reviewed the patient's chart and labs.  Questions were answered to the patient's satisfaction.     [place], [place]

## 2022-06-30 NOTE — Anesthesia Preprocedure Evaluation (Signed)
Anesthesia Evaluation  Patient identified by MRN, date of birth, ID band Patient awake    Reviewed: Allergy & Precautions, NPO status , Patient's Chart, lab work & pertinent test results  History of Anesthesia Complications Negative for: history of anesthetic complications  Airway Mallampati: II  TM Distance: >3 FB Neck ROM: Full    Dental no notable dental hx. (+) Teeth Intact, Implants   Pulmonary neg pulmonary ROS, neg sleep apnea, neg COPD, Patient abstained from smoking.Not current smoker, former smoker   Pulmonary exam normal breath sounds clear to auscultation       Cardiovascular Exercise Tolerance: Good METShypertension, (-) CAD and (-) Past MI (-) dysrhythmias  Rhythm:Regular Rate:Normal - Systolic murmurs    Neuro/Psych Brain tumor causing vocal cord dysfunction , s/p vocal cord implant for hoarseness  negative psych ROS   GI/Hepatic ,neg GERD  ,,(+)     (-) substance abuse    Endo/Other  neg diabetes    Renal/GU negative Renal ROS     Musculoskeletal   Abdominal   Peds  Hematology   Anesthesia Other Findings Past Medical History: No date: Arthritis No date: Hypertension  Reproductive/Obstetrics                             Anesthesia Physical Anesthesia Plan  ASA: 2  Anesthesia Plan: General   Post-op Pain Management: Minimal or no pain anticipated   Induction: Intravenous  PONV Risk Score and Plan: 3 and Propofol infusion, TIVA and Ondansetron  Airway Management Planned: Nasal Cannula  Additional Equipment: None  Intra-op Plan:   Post-operative Plan:   Informed Consent: I have reviewed the patients History and Physical, chart, labs and discussed the procedure including the risks, benefits and alternatives for the proposed anesthesia with the patient or authorized representative who has indicated his/her understanding and acceptance.     Dental advisory  given  Plan Discussed with: CRNA and Surgeon  Anesthesia Plan Comments: (Discussed risks of anesthesia with patient, including possibility of difficulty with spontaneous ventilation under anesthesia necessitating airway intervention, PONV, and rare risks such as cardiac or respiratory or neurological events, and allergic reactions. Discussed the role of CRNA in patient's perioperative care. Patient understands.)       Anesthesia Quick Evaluation

## 2022-07-01 ENCOUNTER — Encounter: Payer: Self-pay | Admitting: Internal Medicine

## 2022-07-01 LAB — SURGICAL PATHOLOGY

## 2023-05-23 ENCOUNTER — Other Ambulatory Visit: Payer: Self-pay | Admitting: Family Medicine

## 2023-05-23 DIAGNOSIS — Z1231 Encounter for screening mammogram for malignant neoplasm of breast: Secondary | ICD-10-CM

## 2023-06-21 ENCOUNTER — Ambulatory Visit
Admission: RE | Admit: 2023-06-21 | Discharge: 2023-06-21 | Disposition: A | Discharge status: Home / Self Care | Payer: Medicare Other | Source: Ambulatory Visit | Attending: Family Medicine | Admitting: Family Medicine

## 2023-06-21 DIAGNOSIS — Z1231 Encounter for screening mammogram for malignant neoplasm of breast: Secondary | ICD-10-CM | POA: Diagnosis present

## 2023-12-29 ENCOUNTER — Other Ambulatory Visit: Payer: Self-pay | Admitting: Nurse Practitioner

## 2023-12-29 DIAGNOSIS — R221 Localized swelling, mass and lump, neck: Secondary | ICD-10-CM

## 2023-12-30 ENCOUNTER — Ambulatory Visit
Admission: RE | Admit: 2023-12-30 | Discharge: 2023-12-30 | Disposition: A | Discharge status: Home / Self Care | Source: Ambulatory Visit | Attending: Nurse Practitioner | Admitting: Nurse Practitioner

## 2023-12-30 DIAGNOSIS — R221 Localized swelling, mass and lump, neck: Secondary | ICD-10-CM | POA: Diagnosis present

## 2024-05-22 ENCOUNTER — Other Ambulatory Visit: Payer: Self-pay | Admitting: Family Medicine

## 2024-05-22 DIAGNOSIS — Z1231 Encounter for screening mammogram for malignant neoplasm of breast: Secondary | ICD-10-CM

## 2024-06-28 ENCOUNTER — Ambulatory Visit
Admission: RE | Admit: 2024-06-28 | Discharge: 2024-06-28 | Disposition: A | Discharge status: Home / Self Care | Source: Ambulatory Visit | Attending: Family Medicine | Admitting: Family Medicine

## 2024-06-28 DIAGNOSIS — Z1231 Encounter for screening mammogram for malignant neoplasm of breast: Secondary | ICD-10-CM | POA: Insufficient documentation
